# Patient Record
Sex: Male | Born: 1985 | Race: White | Marital: Single
Health system: Southern US, Community
[De-identification: ages and names within clinical notes are randomized; demographics above are authoritative.]

---

## 2005-05-25 ENCOUNTER — Emergency Department: Payer: Self-pay | Admitting: Unknown Physician Specialty

## 2018-12-21 ENCOUNTER — Emergency Department
Admission: EM | Admit: 2018-12-21 | Discharge: 2018-12-22 | Disposition: A | Discharge status: Home / Self Care | Payer: 59 | Attending: Emergency Medicine | Admitting: Emergency Medicine

## 2018-12-21 ENCOUNTER — Emergency Department: Payer: 59

## 2018-12-21 ENCOUNTER — Other Ambulatory Visit: Payer: Self-pay

## 2018-12-21 DIAGNOSIS — R0789 Other chest pain: Secondary | ICD-10-CM | POA: Diagnosis present

## 2018-12-21 DIAGNOSIS — J069 Acute upper respiratory infection, unspecified: Secondary | ICD-10-CM | POA: Diagnosis not present

## 2018-12-21 DIAGNOSIS — R05 Cough: Secondary | ICD-10-CM

## 2018-12-21 DIAGNOSIS — R059 Cough, unspecified: Secondary | ICD-10-CM

## 2018-12-21 LAB — CBC
HCT: 49.3 % (ref 39.0–52.0)
Hemoglobin: 16.7 g/dL (ref 13.0–17.0)
MCH: 29 pg (ref 26.0–34.0)
MCHC: 33.9 g/dL (ref 30.0–36.0)
MCV: 85.6 fL (ref 80.0–100.0)
Platelets: 294 10*3/uL (ref 150–400)
RBC: 5.76 MIL/uL (ref 4.22–5.81)
RDW: 12.2 % (ref 11.5–15.5)
WBC: 10.7 10*3/uL — ABNORMAL HIGH (ref 4.0–10.5)
nRBC: 0 % (ref 0.0–0.2)

## 2018-12-21 NOTE — ED Notes (Signed)
DG at bedside. 

## 2018-12-21 NOTE — ED Triage Notes (Signed)
Pt states three days of cough, chest pain. Pt states intermittent shortness of breath. Pt appears anxious and in no acute distress. Pt denies dizziness, nausea.

## 2018-12-22 LAB — TROPONIN I: Troponin I: <0.03 ng/mL (ref <0.03)

## 2018-12-22 LAB — BASIC METABOLIC PANEL
Anion gap: 12 (ref 5–15)
BUN: 10 mg/dL (ref 6–20)
CO2: 27 mmol/L (ref 22–32)
Calcium: 9.6 mg/dL (ref 8.9–10.3)
Chloride: 100 mmol/L (ref 98–111)
Creatinine, Ser: 0.82 mg/dL (ref 0.61–1.24)
GFR calc Af Amer: >60 mL/min (ref >60)
GFR calc non Af Amer: >60 mL/min (ref >60)
Glucose, Bld: 158 mg/dL — ABNORMAL HIGH (ref 70–99)
Potassium: 3.2 mmol/L — ABNORMAL LOW (ref 3.5–5.1)
Sodium: 139 mmol/L (ref 135–145)

## 2018-12-22 NOTE — ED Notes (Signed)
Peripheral IV discontinued. Catheter intact. No signs of infiltration or redness. Gauze applied to IV site.   Discharge instructions reviewed with patient. Questions fielded by this RN. Patient verbalizes understanding of instructions. Patient discharged home in stable condition per [facility]. No acute distress noted at time of discharge.    

## 2018-12-22 NOTE — ED Notes (Signed)
Pt c/o of mid chest tightness, dry cough for 2-3 days, chest pain can't replicate with palpation, pt unable to report

## 2018-12-22 NOTE — ED Provider Notes (Signed)
[facility] Emergency Department Provider Note  ____________________________________________   I have reviewed the triage vital signs and the nursing notes. Where available I have reviewed prior notes and, if possible and indicated, outside hospital notes.    HISTORY  Chief Complaint Cough and Chest Pain    HPI Gregory Nicholson is a 33 y.o. male  Is a 33 year old male with no reported past medical history specifically no personal family history of PE or DVT no history of recent travel, no recent surgery, no leg swelling, presents today complaining of cough, rhinorrhea, slight sore throat, and a discomfort in his chest which is made worse with coughing.  The discomfort is been there since he started coughing 3 days ago.  He does not feel markedly dyspneic.  The pain is all the time.  It is "a little bit", nonradiating, nonpleuritic, it is a little bit worse when he coughs however.  He has had no productive cough.  He denies fever or chills nausea or vomiting.  He has had this pain most of the time for the last 3 days since his cough started.  Is to the right sternal region, some positional component exists.  However he is not orthopneic and he has no exertional or other dyspnea. Over alleviating or aggravating factors no other prior treatment except for he has been taking some Mucinex for his nasal discharge.  Is also had some slight redness to both eyes.  No drainage or discharge however   No past medical history on file.  There are no active problems to display for this patient.     Prior to Admission medications   Not on File    Allergies Patient has no known allergies.  No family history on file.  Social History Social History   Tobacco Use  . Smoking status: Not on file  Substance Use Topics  . Alcohol use: Not on file  . Drug use: Not on file    Review of Systems Constitutional: No fever/chills Eyes: No visual changes. ENT: Mild sore  throat. No stiff neck no neck pain Cardiovascular: HPI regarding chest pain. Respiratory: Denies shortness of breath. Gastrointestinal:   no vomiting.  No diarrhea.  No constipation. Genitourinary: Negative for dysuria. Musculoskeletal: Negative lower extremity swelling Skin: Negative for rash. Neurological: Negative for severe headaches, focal weakness or numbness.   ____________________________________________   PHYSICAL EXAM:  VITAL SIGNS: ED Triage Vitals  Enc Vitals Group     BP 12/21/18 2317 (!) 140/97     Pulse Rate 12/21/18 2317 100     Resp 12/21/18 2317 18     Temp 12/21/18 2317 98.3 F (36.8 C)     Temp Source 12/21/18 2317 Oral     SpO2 12/21/18 2317 100 %     Weight 12/21/18 2318 120 lb (54.4 kg)     Height 12/21/18 2318 5\' 4"  (1.626 m)     Head Circumference --      Peak Flow --      Pain Score 12/21/18 2318 3     Pain Loc --      Pain Edu? --      Excl. in GC? --     Constitutional: Alert and oriented. Well appearing and in no acute distress. Eyes: Conjunctivae are normal, slightly injected bilaterally Head: Atraumatic HEENT: Mild clear congestion/rhinnorhea. Mucous membranes are moist.  Oropharynx non-erythematous Neck:   Nontender with no meningismus, no masses, no stridor Cardiovascular: Normal rate, regular rhythm. Grossly normal heart  sounds.  Good peripheral circulation. Respiratory: Normal respiratory effort.  No retractions. Lungs CTAB. Chest: No shingles noted, no crepitus no flail chest no bruising or lesions noted, there is some very slight tenderness to palpation along the costochondral margin which reproduces his discomfort. Abdominal: Soft and nontender. No distention. No guarding no rebound Back:  There is no focal tenderness or step off.  there is no midline tenderness there are no lesions noted. there is no CVA tenderness Musculoskeletal: No lower extremity tenderness, no upper extremity tenderness. No joint effusions, no DVT signs strong  distal pulses no edema Neurologic:  Normal speech and language. No gross focal neurologic deficits are appreciated.  Skin:  Skin is warm, dry and intact. No rash noted. Psychiatric: Mood and affect are anxious. Speech and behavior are normal.  ____________________________________________   LABS (all labs ordered are listed, but only abnormal results are displayed)  Labs Reviewed  CBC - Abnormal; Notable for the following components:      Result Value   WBC 10.7 (*)    All other components within normal limits  BASIC METABOLIC PANEL  TROPONIN I    Pertinent labs  results that were available during my care of the patient were reviewed by me and considered in my medical decision making (see chart for details). ____________________________________________  EKG  I personally interpreted any EKGs ordered by me or triage Sinus tach rate 108 normal axis no acute ST elevation or depression unremarkable EKG ____________________________________________  RADIOLOGY  Pertinent labs & imaging results that were available during my care of the patient were reviewed by me and considered in my medical decision making (see chart for details). If possible, patient and/or family made aware of any abnormal findings.  Dg Chest 1 View  Result Date: 12/21/2018 CLINICAL DATA:  Cough and chest pain EXAM: CHEST  1 VIEW COMPARISON:  None. FINDINGS: The heart size and mediastinal contours are within normal limits. Both lungs are clear. The visualized skeletal structures are unremarkable. IMPRESSION: No active disease. Electronically Signed   By: Jasmine Pang M.D.   On: 12/21/2018 23:40   ____________________________________________    PROCEDURES  Procedure(s) performed: None  Procedures  Critical Care performed: None  ____________________________________________   INITIAL IMPRESSION / ASSESSMENT AND PLAN / ED COURSE  Pertinent labs & imaging results that were available during my care of the  patient were reviewed by me and considered in my medical decision making (see chart for details).  Here with URI symptoms rhinorrhea cough mild conjunctival injection, slight sore throat and discomfort in his chest which is reproducible.  No risk factors for ACS PE or dissection.  Patient was PERC negative upon arrival and I have very low suspicion for significant intrathoracic pathology today.  However, we did do a chest x-ray which is negative EKG is reassuring, is certainly possible that he has coronavirus although at this time there is no indication for hospitalization his oxygen saturations are 100%.  Gregory Nicholson was evaluated in Emergency Department on 12/22/2018 for the symptoms described in the history of present illness. He was evaluated in the context of the global COVID-19 pandemic, which necessitated consideration that the patient might be at risk for infection with the SARS-CoV-2 virus that causes COVID-19. Institutional protocols and algorithms that pertain to the evaluation of patients at risk for COVID-19 are in a state of rapid change based on information released by regulatory bodies including the CDC and federal and state organizations. These policies and algorithms were followed during  the patient's care in the ED.    ____________________________________________   FINAL CLINICAL IMPRESSION(S) / ED DIAGNOSES  Final diagnoses:  None      This chart was dictated using voice recognition software.  Despite best efforts to proofread,  errors can occur which can change meaning.      Jeanmarie Plant, MD 12/22/18 [phone]

## 2018-12-22 NOTE — ED Notes (Signed)
Registration locked pt signature

## 2018-12-22 NOTE — Discharge Instructions (Addendum)
Person Under Monitoring Name: Gregory Nicholson  Location: [address] [place] [address]   Infection Prevention Recommendations for Individuals Confirmed to have, or Being Evaluated for, 2019 Novel Coronavirus (COVID-19) Infection Who Receive Care at Home  Individuals who are confirmed to have, or are being evaluated for, COVID-19 should follow the prevention steps below until a healthcare provider or local or state health department says they can return to normal activities.  Stay home except to get medical care You should restrict activities outside your home, except for getting medical care. Do not go to work, school, or public areas, and do not use public transportation or taxis.  Call ahead before visiting your doctor Before your medical appointment, call the healthcare provider and tell them that you have, or are being evaluated for, COVID-19 infection. This will help the healthcare providers office take steps to keep other people from getting infected. Ask your healthcare provider to call the local or state health department.  Monitor your symptoms Seek prompt medical attention if your illness is worsening (e.g., difficulty breathing). Before going to your medical appointment, call the healthcare provider and tell them that you have, or are being evaluated for, COVID-19 infection. Ask your healthcare provider to call the local or state health department.  Wear a facemask You should wear a facemask that covers your nose and mouth when you are in the same room with other people and when you visit a healthcare provider. People who live with or visit you should also wear a facemask while they are in the same room with you.  Separate yourself from other people in your home As much as possible, you should stay in a different room from other people in your home. Also, you should use a separate bathroom, if available.  Avoid sharing household items You should not  share dishes, drinking glasses, cups, eating utensils, towels, bedding, or other items with other people in your home. After using these items, you should wash them thoroughly with soap and water.  Cover your coughs and sneezes Cover your mouth and nose with a tissue when you cough or sneeze, or you can cough or sneeze into your sleeve. Throw used tissues in a lined trash can, and immediately wash your hands with soap and water for at least 20 seconds or use an alcohol-based hand rub.  Wash your [employer] your hands often and thoroughly with soap and water for at least 20 seconds. You can use an alcohol-based hand sanitizer if soap and water are not available and if your hands are not visibly dirty. Avoid touching your eyes, nose, and mouth with unwashed hands.   Prevention Steps for Caregivers and Household Members of Individuals Confirmed to have, or Being Evaluated for, COVID-19 Infection Being Cared for in the Home  If you live with, or provide care at home for, a person confirmed to have, or being evaluated for, COVID-19 infection please follow these guidelines to prevent infection:  Follow healthcare providers instructions Make sure that you understand and can help the patient follow any healthcare provider instructions for all care.  Provide for the patients basic needs You should help the patient with basic needs in the home and provide support for getting groceries, prescriptions, and other personal needs.  Monitor the patients symptoms If they are getting sicker, call his or her medical provider and tell them that the patient has, or is being evaluated for, COVID-19 infection. This will help the healthcare  providers office take steps to keep other people from getting infected. Ask the healthcare provider to call the local or state health department.  Limit the number of people who have contact with the patient If possible, have only one caregiver for the  patient. Other household members should stay in another home or place of residence. If this is not possible, they should stay in another room, or be separated from the patient as much as possible. Use a separate bathroom, if available. Restrict visitors who do not have an essential need to be in the home.  Keep older adults, very young children, and other sick people away from the patient Keep older adults, very young children, and those who have compromised immune systems or chronic health conditions away from the patient. This includes people with chronic heart, lung, or kidney conditions, diabetes, and cancer.  Ensure good ventilation Make sure that shared spaces in the home have good air flow, such as from an air conditioner or an opened window, weather permitting.  Wash your hands often Wash your hands often and thoroughly with soap and water for at least 20 seconds. You can use an alcohol based hand sanitizer if soap and water are not available and if your hands are not visibly dirty. Avoid touching your eyes, nose, and mouth with unwashed hands. Use disposable paper towels to dry your hands. If not available, use dedicated cloth towels and replace them when they become wet.  Wear a facemask and gloves Wear a disposable facemask at all times in the room and gloves when you touch or have contact with the patients blood, body fluids, and/or secretions or excretions, such as sweat, saliva, sputum, nasal mucus, vomit, urine, or feces.  Ensure the mask fits over your nose and mouth tightly, and do not touch it during use. Throw out disposable facemasks and gloves after using them. Do not reuse. Wash your hands immediately after removing your facemask and gloves. If your personal clothing becomes contaminated, carefully remove clothing and launder. Wash your hands after handling contaminated clothing. Place all used disposable facemasks, gloves, and other waste in a lined container before  disposing them with other household waste. Remove gloves and wash your hands immediately after handling these items.  Do not share dishes, glasses, or other household items with the patient Avoid sharing household items. You should not share dishes, drinking glasses, cups, eating utensils, towels, bedding, or other items with a patient who is confirmed to have, or being evaluated for, COVID-19 infection. After the person uses these items, you should wash them thoroughly with soap and water.  Wash laundry thoroughly Immediately remove and wash clothes or bedding that have blood, body fluids, and/or secretions or excretions, such as sweat, saliva, sputum, nasal mucus, vomit, urine, or feces, on them. Wear gloves when handling laundry from the patient. Read and follow directions on labels of laundry or clothing items and detergent. In general, wash and dry with the warmest temperatures recommended on the label.  Clean all areas the individual has used often Clean all touchable surfaces, such as counters, tabletops, doorknobs, bathroom fixtures, toilets, phones, keyboards, tablets, and bedside tables, every day. Also, clean any surfaces that may have blood, body fluids, and/or secretions or excretions on them. Wear gloves when cleaning surfaces the patient has come in contact with. Use a diluted bleach solution (e.g., dilute bleach with 1 part bleach and 10 parts water) or a household disinfectant with a label that says EPA-registered for coronaviruses. To make  a bleach solution at home, add 1 tablespoon of bleach to 1 quart (4 cups) of water. For a larger supply, add  cup of bleach to 1 gallon (16 cups) of water. Read labels of cleaning products and follow recommendations provided on product labels. Labels contain instructions for safe and effective use of the cleaning product including precautions you should take when applying the product, such as wearing gloves or eye protection and making sure you  have good ventilation during use of the product. Remove gloves and wash hands immediately after cleaning.  Monitor yourself for signs and symptoms of illness Caregivers and household members are considered close contacts, should monitor their health, and will be asked to limit movement outside of the home to the extent possible. Follow the monitoring steps for close contacts listed on the symptom monitoring form.   ? If you have additional questions, contact your local health department or call the epidemiologist on call at [phone] (available 24/7). ? This guidance is subject to change. For the most up-to-date guidance from [facility], please refer to their website: [email or web address]

## 2018-12-22 NOTE — ED Notes (Signed)
Pt refused COVID test 

## 2018-12-22 NOTE — ED Notes (Signed)
Demographics and billing info updated with pt

## 2020-03-04 ENCOUNTER — Other Ambulatory Visit
Admission: RE | Admit: 2020-03-04 | Discharge: 2020-03-04 | Disposition: A | Discharge status: Home / Self Care | Payer: 59 | Source: Ambulatory Visit | Attending: Student | Admitting: Student

## 2020-03-04 DIAGNOSIS — R197 Diarrhea, unspecified: Secondary | ICD-10-CM | POA: Diagnosis not present

## 2020-03-04 LAB — CBC
HCT: 45.5 % (ref 39.0–52.0)
Hemoglobin: 15.8 g/dL (ref 13.0–17.0)
MCH: 29.4 pg (ref 26.0–34.0)
MCHC: 34.7 g/dL (ref 30.0–36.0)
MCV: 84.7 fL (ref 80.0–100.0)
Platelets: 233 10*3/uL (ref 150–400)
RBC: 5.37 MIL/uL (ref 4.22–5.81)
RDW: 12.8 % (ref 11.5–15.5)
WBC: 11.1 10*3/uL — ABNORMAL HIGH (ref 4.0–10.5)
nRBC: 0 % (ref 0.0–0.2)

## 2020-09-04 IMAGING — DX CHEST  1 VIEW
1 series · 1 of 1 positions shown · non-contrast
Comparison: None.

CLINICAL DATA: Cough and chest pain

EXAM:
CHEST  1 VIEW

[chest ap]
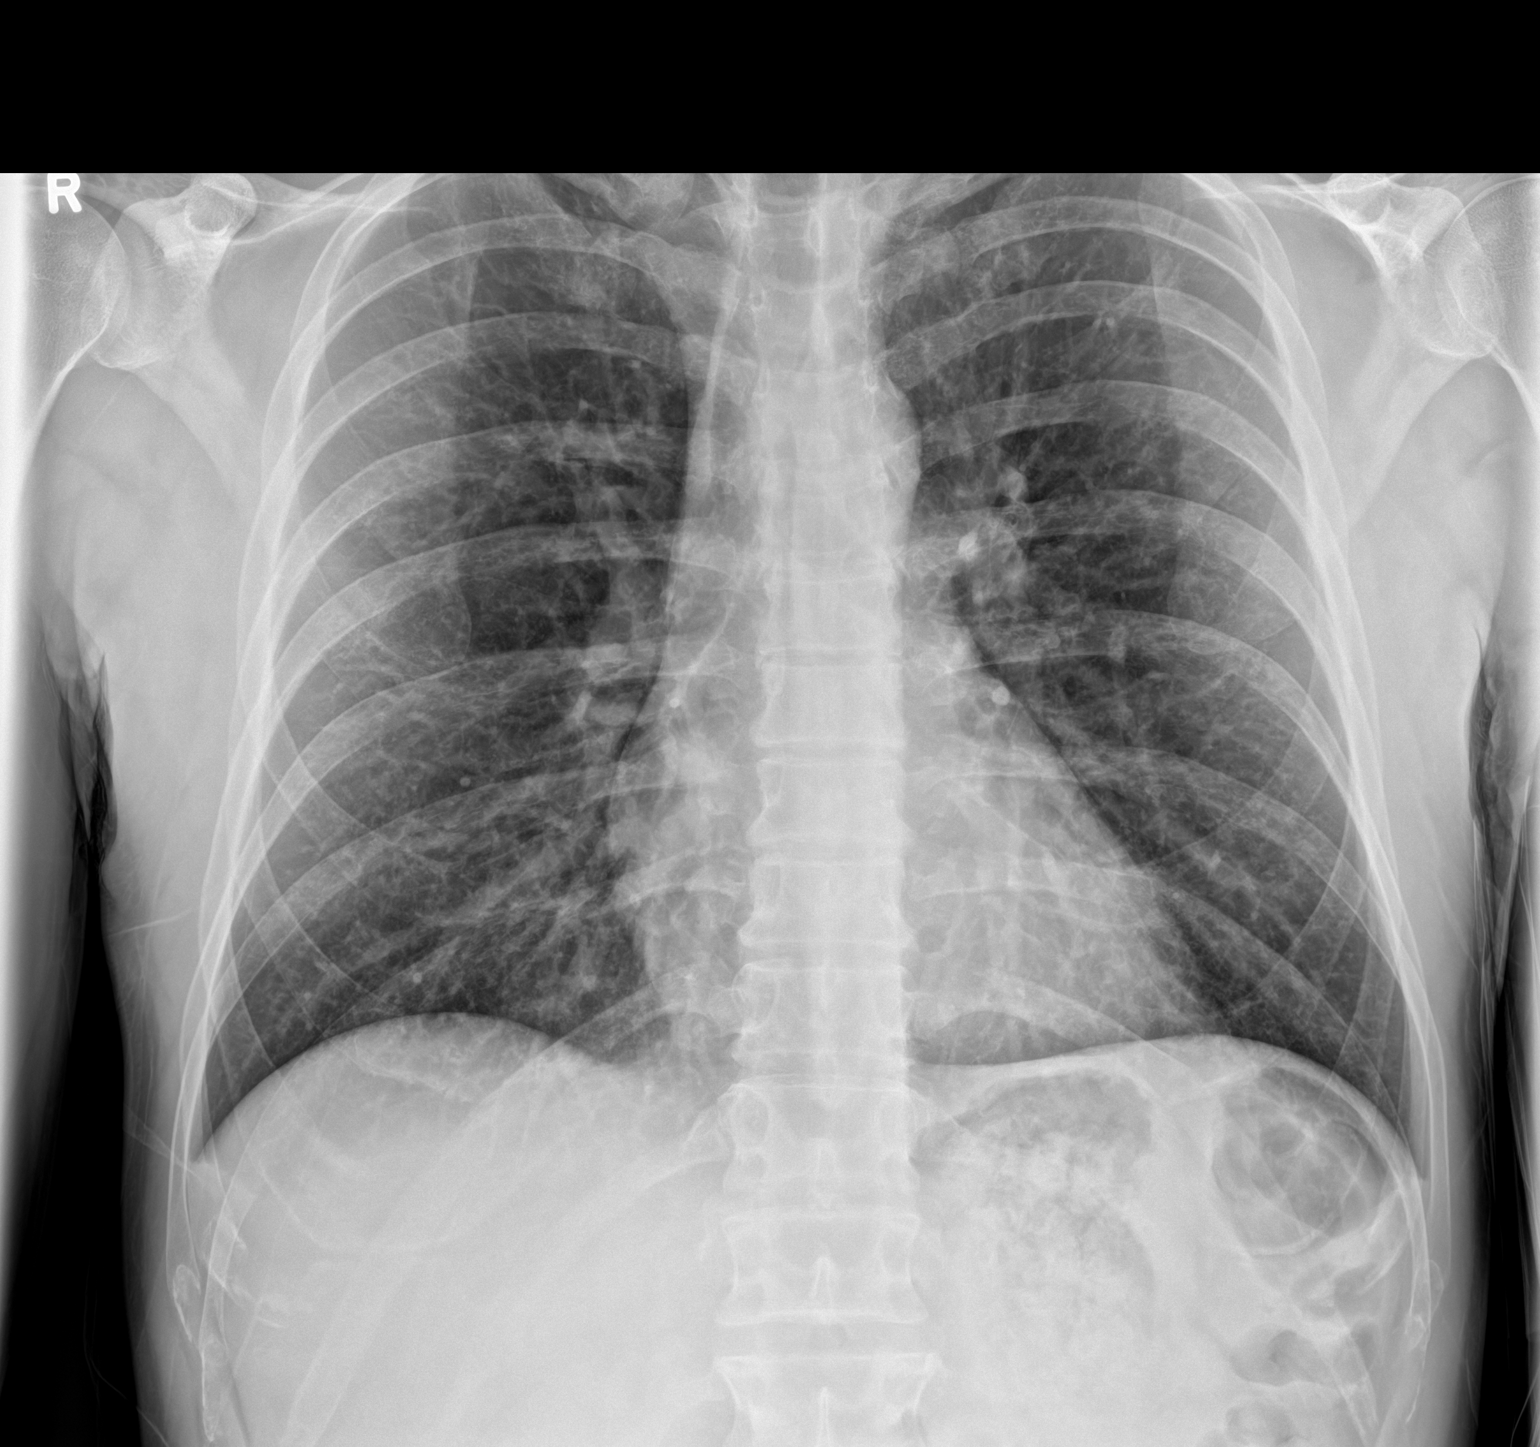

[1 of 1 positions shown; findings below may reference images not displayed]

FINDINGS: The heart size and mediastinal contours are within normal limits.
Both lungs are clear. The visualized skeletal structures are
unremarkable.
IMPRESSION: No active disease.
